# Patient Record
Sex: Female | Born: 1990 | Race: Black or African American | Hispanic: No | Marital: Single | Smoking: Never smoker
Health system: Southern US, Community
[De-identification: ages and names within clinical notes are randomized; demographics above are authoritative.]

---

## 2016-05-01 ENCOUNTER — Institutional Professional Consult (permissible substitution): Payer: Self-pay | Admitting: Pulmonary Disease

## 2016-06-01 ENCOUNTER — Ambulatory Visit (INDEPENDENT_AMBULATORY_CARE_PROVIDER_SITE_OTHER): Payer: Self-pay | Admitting: Pulmonary Disease

## 2016-06-01 ENCOUNTER — Encounter: Payer: Self-pay | Admitting: Pulmonary Disease

## 2016-06-01 VITALS — BP 120/78 | HR 95 | Ht 62.0 in | Wt 151.0 lb

## 2016-06-01 DIAGNOSIS — R0602 Shortness of breath: Secondary | ICD-10-CM

## 2016-06-01 LAB — NITRIC OXIDE: NITRIC OXIDE: 14

## 2016-06-01 NOTE — Patient Instructions (Signed)
Based on the tests we have done today and the  lack of symptoms I do not believe you have asthma. Please call us back if any additional testing is required.  Return to clinic as needed.

## 2016-06-01 NOTE — Progress Notes (Signed)
Brooke KluverBrittany Grigorian    [id number]    [birth year]  Primary Care Physician:No PCP Per Patient  Referring Physician: No referring provider defined for this encounter.  Chief complaint:  Consult for evaluation of asthma  HPI:  26 year old with no significant past medical history. She had enlisted in [employer] in 2012. She had an episode of dyspnea and was evaluated for asthma then. She apparently had pulmonary function test in 2013 with no abnormality. She is here for reevaluation as she is planning on reenlisting.  She denies any dyspnea, wheezing. She does not have any allergies. She denies any cough, sputum production, chest pain, palpitations. She is not on any medications and has no recorded allergies.  No outpatient encounter prescriptions on file as of 06/01/2016.   No facility-administered encounter medications on file as of 06/01/2016.     Allergies as of 06/01/2016  . (Not on File)    No past medical history on file.  No past surgical history on file.  No family history on file.  Social History   Social History  . Marital status: Single    Spouse name: N/A  . Number of children: N/A  . Years of education: N/A   Occupational History  . Not on file.   Social History Main Topics  . Smoking status: Never Smoker  . Smokeless tobacco: Never Used  . Alcohol use Not on file  . Drug use: Unknown  . Sexual activity: Not on file   Other Topics Concern  . Not on file   Social History Narrative  . No narrative on file    Review of systems: Review of Systems  Constitutional: Negative for fever and chills.  HENT: Negative.   Eyes: Negative for blurred vision.  Respiratory: as per HPI  Cardiovascular: Negative for chest pain and palpitations.  Gastrointestinal: Negative for vomiting, diarrhea, blood per rectum. Genitourinary: Negative for dysuria, urgency, frequency and hematuria.  Musculoskeletal: Negative for myalgias, back pain and joint pain.  Skin:  Negative for itching and rash.  Neurological: Negative for dizziness, tremors, focal weakness, seizures and loss of consciousness.  Endo/Heme/Allergies: Negative for environmental allergies.  Psychiatric/Behavioral: Negative for depression, suicidal ideas and hallucinations.  All other systems reviewed and are negative.  Physical Exam: Blood pressure 120/78, pulse 95, height 5\' 2"  (1.575 m), weight 151 lb (68.5 kg), SpO2 98 %. Gen:      No acute distress HEENT:  EOMI, sclera anicteric Neck:     No masses; no thyromegaly Lungs:    Clear to auscultation bilaterally; normal respiratory effort CV:         Regular rate and rhythm; no murmurs Abd:      + bowel sounds; soft, non-tender; no palpable masses, no distension Ext:    No edema; adequate peripheral perfusion Skin:      Warm and dry; no rash Neuro: alert and oriented x 3 Psych: normal mood and affect  Data Reviewed: FENO 06/01/16-14  PFTs 06/01/16 FVC 3.26 [107%) FEV1 2.83 [105%] F/F 87 Normal spirometry. No obstructive lung disease.  Assessment:  Assessment for asthma  Although she has a diagnosis of asthma in the past she had an isolated episode of dyspnea while exercising. There is no recurrence of symptoms since then. She does not have any signs and symptoms suggestive of asthma  I have reviewed her spirometry which is normal with no evidence of obstruction. Based on her normal PFTs, lack of symptoms and low fractional exhaled  nitric oxide (FENO)  I do not believe she has asthma.   Chilton Greathouse MD [facility] Pager [phone] 06/01/2016, 10:23 AM  CC: No ref. provider found             56

## 2018-02-05 ENCOUNTER — Emergency Department
Admission: EM | Admit: 2018-02-05 | Discharge: 2018-02-05 | Disposition: A | Discharge status: Home / Self Care | Payer: Self-pay | Attending: Emergency Medicine | Admitting: Emergency Medicine

## 2018-02-05 ENCOUNTER — Other Ambulatory Visit: Payer: Self-pay

## 2018-02-05 DIAGNOSIS — R51 Headache: Secondary | ICD-10-CM | POA: Insufficient documentation

## 2018-02-05 DIAGNOSIS — R519 Headache, unspecified: Secondary | ICD-10-CM

## 2018-02-05 MED ORDER — PROCHLORPERAZINE MALEATE 10 MG PO TABS: 10.0000 mg | ORAL_TABLET | Freq: Four times a day (QID) | ORAL | 0 refills | Status: AC | PRN

## 2018-02-05 MED ORDER — PROCHLORPERAZINE MALEATE 10 MG PO TABS
10.0000 mg | ORAL_TABLET | Freq: Once | ORAL | Status: AC
  Administered 2018-02-05: 10 mg via ORAL
  Filled 2018-02-05: qty 1

## 2018-02-05 NOTE — ED Provider Notes (Signed)
[facility] Emergency Department Provider Note   ____________________________________________   First MD Initiated Contact with Patient 02/05/18 0117     (approximate)  I have reviewed the triage vital signs and the nursing notes.   HISTORY  Chief Complaint Headache    HPI Brooke Reilly is a 27 y.o. female who presents to the ED from home with a chief complaint of headache.  Patient identifies with female pronoun.  Reports gradual onset frontal headache x2 days, constant, associated with photophobia.  Took Tylenol without relief of symptoms.  Denies history of migraines.  Denies associated fever, chills, vision changes, neck pain, chest pain, shortness of breath, abdominal pain, nausea, vomiting or dizziness.  Denies recent travel or trauma.  Denies anticoagulant use.  Does take testosterone.  Admits to increased stress.   Past medical history None  There are no active problems to display for this patient.   History reviewed. No pertinent surgical history.  Prior to Admission medications   Medication Sig Start Date End Date Taking? Authorizing Provider  prochlorperazine (COMPAZINE) 10 MG tablet Take 1 tablet (10 mg total) by mouth every 6 (six) hours as needed for nausea (headache). 02/05/18   Irean Hong, MD    Allergies Patient has no known allergies.  No family history on file.  Social History Social History   Tobacco Use  . Smoking status: Never Smoker  . Smokeless tobacco: Never Used  Substance Use Topics  . Alcohol use: Not on file  . Drug use: Not on file    Review of Systems  Constitutional: No fever/chills Eyes: No visual changes. ENT: No sore throat. Cardiovascular: Denies chest pain. Respiratory: Denies shortness of breath. Gastrointestinal: No abdominal pain.  No nausea, no vomiting.  No diarrhea.  No constipation. Genitourinary: Negative for dysuria. Musculoskeletal: Negative for back pain. Skin: Negative for  rash. Neurological: Positive for headache.  Negative for focal weakness or numbness.   ____________________________________________   PHYSICAL EXAM:  VITAL SIGNS: ED Triage Vitals  Enc Vitals Group     BP 02/05/18 0055 122/77     Pulse Rate 02/05/18 0055 82     Resp 02/05/18 0055 17     Temp 02/05/18 0055 98.1 F (36.7 C)     Temp Source 02/05/18 0055 Oral     SpO2 02/05/18 0055 97 %     Weight 02/05/18 0054 143 lb (64.9 kg)     Height 02/05/18 0054 5\' 2"  (1.575 m)     Head Circumference --      Peak Flow --      Pain Score 02/05/18 0054 10     Pain Loc --      Pain Edu? --      Excl. in GC? --     Constitutional: Alert and oriented. Well appearing and in no acute distress. Eyes: Conjunctivae are normal. PERRL. EOMI. Fundoscopy within normal limits. Head: Atraumatic. Nose: No congestion/rhinnorhea. Mouth/Throat: Mucous membranes are moist.  Oropharynx non-erythematous. Neck: No stridor.  No carotid bruits.  Supple neck without meningismus. Cardiovascular: Normal rate, regular rhythm. Grossly normal heart sounds.  Good peripheral circulation. Respiratory: Normal respiratory effort.  No retractions. Lungs CTAB. Gastrointestinal: Soft and nontender. No distention. No abdominal bruits. No CVA tenderness. Musculoskeletal: No lower extremity tenderness nor edema.  No joint effusions. Neurologic:  Normal speech and language. No gross focal neurologic deficits are appreciated. No gait instability. Skin:  Skin is warm, dry and intact. No rash noted.  No petechiae. Psychiatric: Mood  and affect are normal. Speech and behavior are normal.  ____________________________________________   LABS (all labs ordered are listed, but only abnormal results are displayed)  Labs Reviewed - No data to display ____________________________________________  EKG  None ____________________________________________  RADIOLOGY  ED MD interpretation: None  Official radiology report(s): No  results found.  ____________________________________________   PROCEDURES  Procedure(s) performed: None  Procedures  Critical Care performed: No  ____________________________________________   INITIAL IMPRESSION / ASSESSMENT AND PLAN / ED COURSE  As part of my medical decision making, I reviewed the following data within the electronic MEDICAL RECORD NUMBER Nursing notes reviewed and incorporated, Old chart reviewed and Notes from prior ED visits   27 year old female to female who presents with gradual onset headache. Differential diagnosis includes, but is not limited to, intracranial hemorrhage, meningitis/encephalitis, previous head trauma, cavernous venous thrombosis, tension headache, temporal arteritis, migraine or migraine equivalent, idiopathic intracranial hypertension, and non-specific headache.  Neck is supple and patient does not exhibit focal neurological deficits.  I did offer a CT head for reassurance but patient declines.  Patient prefers PO occasions.  Will administer Compazine now and prescription on discharge.  Strict return precautions given.  Patient verbalizes understanding agrees with plan of care.      ____________________________________________   FINAL CLINICAL IMPRESSION(S) / ED DIAGNOSES  Final diagnoses:  Acute nonintractable headache, unspecified headache type     ED Discharge Orders         Ordered    prochlorperazine (COMPAZINE) 10 MG tablet  Every 6 hours PRN     02/05/18 0129           Note:  This document was prepared using Dragon voice recognition software and may include unintentional dictation errors.    Irean HongSung,  J, MD 02/05/18 [phone]

## 2018-02-05 NOTE — ED Triage Notes (Signed)
Pt arrives to ED via POV from home with c/o "off and on" headache x2 days. Pt denies N/V/D or fever. Pt denies taking any OTC medications PTA; denies h/x of migraines or previous issues with headaches.

## 2018-02-05 NOTE — ED Notes (Signed)
Reviewed discharge instructions, follow-up care, and prescriptions with patient. Patient verbalized understanding of all information reviewed. Patient stable, with no distress noted at this time.    

## 2018-02-05 NOTE — Discharge Instructions (Addendum)
1.  You may take Compazine as needed for headache and associated nausea. 2.  Return to the ER for worsening symptoms, persistent vomiting, lethargy or other concerns.

## 2018-02-05 NOTE — ED Notes (Signed)
ED Provider at bedside. 

## 2018-02-22 ENCOUNTER — Emergency Department
Admission: EM | Admit: 2018-02-22 | Discharge: 2018-02-22 | Disposition: A | Discharge status: Home / Self Care | Payer: Self-pay | Attending: Emergency Medicine | Admitting: Emergency Medicine

## 2018-02-22 ENCOUNTER — Encounter: Payer: Self-pay | Admitting: Emergency Medicine

## 2018-02-22 ENCOUNTER — Emergency Department: Payer: Self-pay

## 2018-02-22 ENCOUNTER — Other Ambulatory Visit: Payer: Self-pay

## 2018-02-22 DIAGNOSIS — Y929 Unspecified place or not applicable: Secondary | ICD-10-CM | POA: Insufficient documentation

## 2018-02-22 DIAGNOSIS — Z79899 Other long term (current) drug therapy: Secondary | ICD-10-CM | POA: Insufficient documentation

## 2018-02-22 DIAGNOSIS — R0789 Other chest pain: Secondary | ICD-10-CM | POA: Insufficient documentation

## 2018-02-22 DIAGNOSIS — T148XXA Other injury of unspecified body region, initial encounter: Secondary | ICD-10-CM

## 2018-02-22 DIAGNOSIS — Z5321 Procedure and treatment not carried out due to patient leaving prior to being seen by health care provider: Secondary | ICD-10-CM | POA: Insufficient documentation

## 2018-02-22 DIAGNOSIS — X58XXXA Exposure to other specified factors, initial encounter: Secondary | ICD-10-CM | POA: Insufficient documentation

## 2018-02-22 DIAGNOSIS — Y939 Activity, unspecified: Secondary | ICD-10-CM | POA: Insufficient documentation

## 2018-02-22 DIAGNOSIS — R079 Chest pain, unspecified: Secondary | ICD-10-CM | POA: Insufficient documentation

## 2018-02-22 DIAGNOSIS — S29012A Strain of muscle and tendon of back wall of thorax, initial encounter: Secondary | ICD-10-CM | POA: Insufficient documentation

## 2018-02-22 DIAGNOSIS — Y999 Unspecified external cause status: Secondary | ICD-10-CM | POA: Insufficient documentation

## 2018-02-22 LAB — CBC WITH DIFFERENTIAL/PLATELET
Abs Immature Granulocytes: 0.01 10*3/uL (ref 0.00–0.07)
Basophils Absolute: 0.1 10*3/uL (ref 0.0–0.1)
Basophils Relative: 1 %
Eosinophils Absolute: 0 10*3/uL (ref 0.0–0.5)
Eosinophils Relative: 1 %
HCT: 43.7 % (ref 36.0–46.0)
Hemoglobin: 15.1 g/dL — ABNORMAL HIGH (ref 12.0–15.0)
Immature Granulocytes: 0 %
Lymphocytes Relative: 44 %
Lymphs Abs: 1.8 10*3/uL (ref 0.7–4.0)
MCH: 30.6 pg (ref 26.0–34.0)
MCHC: 34.6 g/dL (ref 30.0–36.0)
MCV: 88.5 fL (ref 80.0–100.0)
Monocytes Absolute: 0.7 10*3/uL (ref 0.1–1.0)
Monocytes Relative: 18 %
NEUTROS ABS: 1.4 10*3/uL — AB (ref 1.7–7.7)
Neutrophils Relative %: 36 %
Platelets: 169 10*3/uL (ref 150–400)
RBC: 4.94 MIL/uL (ref 3.87–5.11)
RDW: 11.9 % (ref 11.5–15.5)
SMEAR REVIEW: ADEQUATE
WBC: 4.1 10*3/uL (ref 4.0–10.5)
nRBC: 0 % (ref 0.0–0.2)

## 2018-02-22 LAB — COMPREHENSIVE METABOLIC PANEL
ALT: 19 U/L (ref 0–44)
AST: 32 U/L (ref 15–41)
Albumin: 4.6 g/dL (ref 3.5–5.0)
Alkaline Phosphatase: 61 U/L (ref 38–126)
Anion gap: 8 (ref 5–15)
BUN: 12 mg/dL (ref 6–20)
CO2: 29 mmol/L (ref 22–32)
CREATININE: 0.87 mg/dL (ref 0.44–1.00)
Calcium: 9 mg/dL (ref 8.9–10.3)
Chloride: 100 mmol/L (ref 98–111)
GFR calc Af Amer: >60 mL/min (ref >60)
GFR calc non Af Amer: >60 mL/min (ref >60)
Glucose, Bld: 105 mg/dL — ABNORMAL HIGH (ref 70–99)
Potassium: 3.2 mmol/L — ABNORMAL LOW (ref 3.5–5.1)
SODIUM: 137 mmol/L (ref 135–145)
Total Bilirubin: 0.8 mg/dL (ref 0.3–1.2)
Total Protein: 7.7 g/dL (ref 6.5–8.1)

## 2018-02-22 LAB — TROPONIN I: Troponin I: <0.03 ng/mL (ref <0.03)

## 2018-02-22 LAB — PATHOLOGIST SMEAR REVIEW

## 2018-02-22 NOTE — ED Triage Notes (Signed)
Here earlier for back/chest pain. Left immediately after chest xray because didn't want to wait. Came back requesting results of tests done and explained cannot give results without seeing a provider. Patient checked back in so can get results. No new complaints.

## 2018-02-22 NOTE — ED Notes (Signed)
No answer when called several times from lobby 

## 2018-02-22 NOTE — ED Notes (Signed)
POC URINE PREG NEGATIVE.  
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
## 2018-02-22 NOTE — ED Provider Notes (Signed)
[facility] Emergency Department Provider Note   ____________________________________________    I have reviewed the triage vital signs and the nursing notes.   HISTORY  Chief Complaint Back Pain     HPI Brooke Reilly is a 27 y.o. female who presents with complaints of left upper chest and back discomfort which started 24 hours ago.  She reports the pain is mild and worse with movement of her left arm.  Came to the ED last night had labs x-ray and then decided that it was an emergency and decided to leave.  She returns today to obtain results.  She reports she feels somewhat better.  No shortness of breath.  No pleurisy.  No calf pain or swelling.  No recent travel.  No nausea vomiting or diaphoresis.  On testerone   History reviewed. No pertinent past medical history.  There are no active problems to display for this patient.   History reviewed. No pertinent surgical history.  Prior to Admission medications   Medication Sig Start Date End Date Taking? Authorizing Provider  prochlorperazine (COMPAZINE) 10 MG tablet Take 1 tablet (10 mg total) by mouth every 6 (six) hours as needed for nausea (headache). 02/05/18   Irean HongSung, Jade J, MD     Allergies Patient has no known allergies.  History reviewed. No pertinent family history.  Social History Social History   Tobacco Use  . Smoking status: Never Smoker  . Smokeless tobacco: Never Used  Substance Use Topics  . Alcohol use: Never    Frequency: Never  . Drug use: Never    Review of Systems  Constitutional: No fever/chills Eyes: No visual changes.  ENT: No neck pain Cardiovascular: As above Respiratory: Denies shortness of breath.  No pleurisy Gastrointestinal:   No nausea, no vomiting.   Genitourinary: Negative for dysuria. Musculoskeletal: No low back pain Skin: Negative for rash. Neurological: Negative for headaches    ____________________________________________   PHYSICAL  EXAM:  VITAL SIGNS: ED Triage Vitals  Enc Vitals Group     BP 02/22/18 0652 128/88     Pulse Rate 02/22/18 0652 94     Resp 02/22/18 0652 16     Temp 02/22/18 0652 99.5 F (37.5 C)     Temp Source 02/22/18 0652 Oral     SpO2 02/22/18 0652 98 %     Weight 02/22/18 0653 65.8 kg (144 lb 15.9 oz)     Height 02/22/18 0653 1.575 m (5\' 2" )     Head Circumference --      Peak Flow --      Pain Score 02/22/18 0652 8     Pain Loc --      Pain Edu? --      Excl. in GC? --     Constitutional: Alert and oriented. No acute distress.  Eyes: Conjunctivae are normal.   Nose: No congestion/rhinnorhea. Mouth/Throat: Mucous membranes are moist.    Cardiovascular: Normal rate, regular rhythm. Grossly normal heart sounds.  Good peripheral circulation.  No chest wall tenderness palpation, pain in the left anterior superior chest wall with flexion of the left arm Respiratory: Normal respiratory effort.  No retractions. Lungs CTAB. Gastrointestinal: Soft and nontender. No distention.    Musculoskeletal: No lower extremity tenderness nor edema.  Warm and well perfused Neurologic:  Normal speech and language. No gross focal neurologic deficits are appreciated.  Skin:  Skin is warm, dry and intact. No rash noted. Psychiatric: Mood and affect are normal. Speech and behavior  are normal.  ____________________________________________   LABS (all labs ordered are listed, but only abnormal results are displayed)  Labs Reviewed - No data to display ____________________________________________  EKG  ED ECG REPORT I, Jene Every, the attending physician, personally viewed and interpreted this ECG.,  Performed at previous visit where the patient left  Date: 02/22/2018  Rhythm: normal sinus rhythm QRS Axis: normal Intervals: normal ST/T Wave abnormalities: Nonspecific T wave changes   ____________________________________________  RADIOLOGY  Chest x-ray  normal ____________________________________________   PROCEDURES  Procedure(s) performed: No  Procedures   Critical Care performed: No ____________________________________________   INITIAL IMPRESSION / ASSESSMENT AND PLAN / ED COURSE  Pertinent labs & imaging results that were available during my care of the patient were reviewed by me and considered in my medical decision making (see chart for details).  Patient overall well-appearing in no acute distress.  Very low risk for ACS.  EKG with nonspecific changes.  Troponin is normal.  Given exam with reproducible pain with left arm movement strongly suspect muscle strain.  Chest x-ray labs unremarkable.  Patient well-appearing, recommend supportive care outpatient follow-up, return precautions discussed    ____________________________________________   FINAL CLINICAL IMPRESSION(S) / ED DIAGNOSES  Final diagnoses:  Muscle strain  Chest wall pain        Note:  This document was prepared using Dragon voice recognition software and may include unintentional dictation errors.    Jene Every, MD 02/22/18 [phone]

## 2020-10-17 IMAGING — CR DG CHEST 2V
2 series · 2 of 2 positions shown · non-contrast
Comparison: None.

CLINICAL DATA: 27-year-old female with chest pain.

EXAM:
CHEST - 2 VIEW

[chest pa]
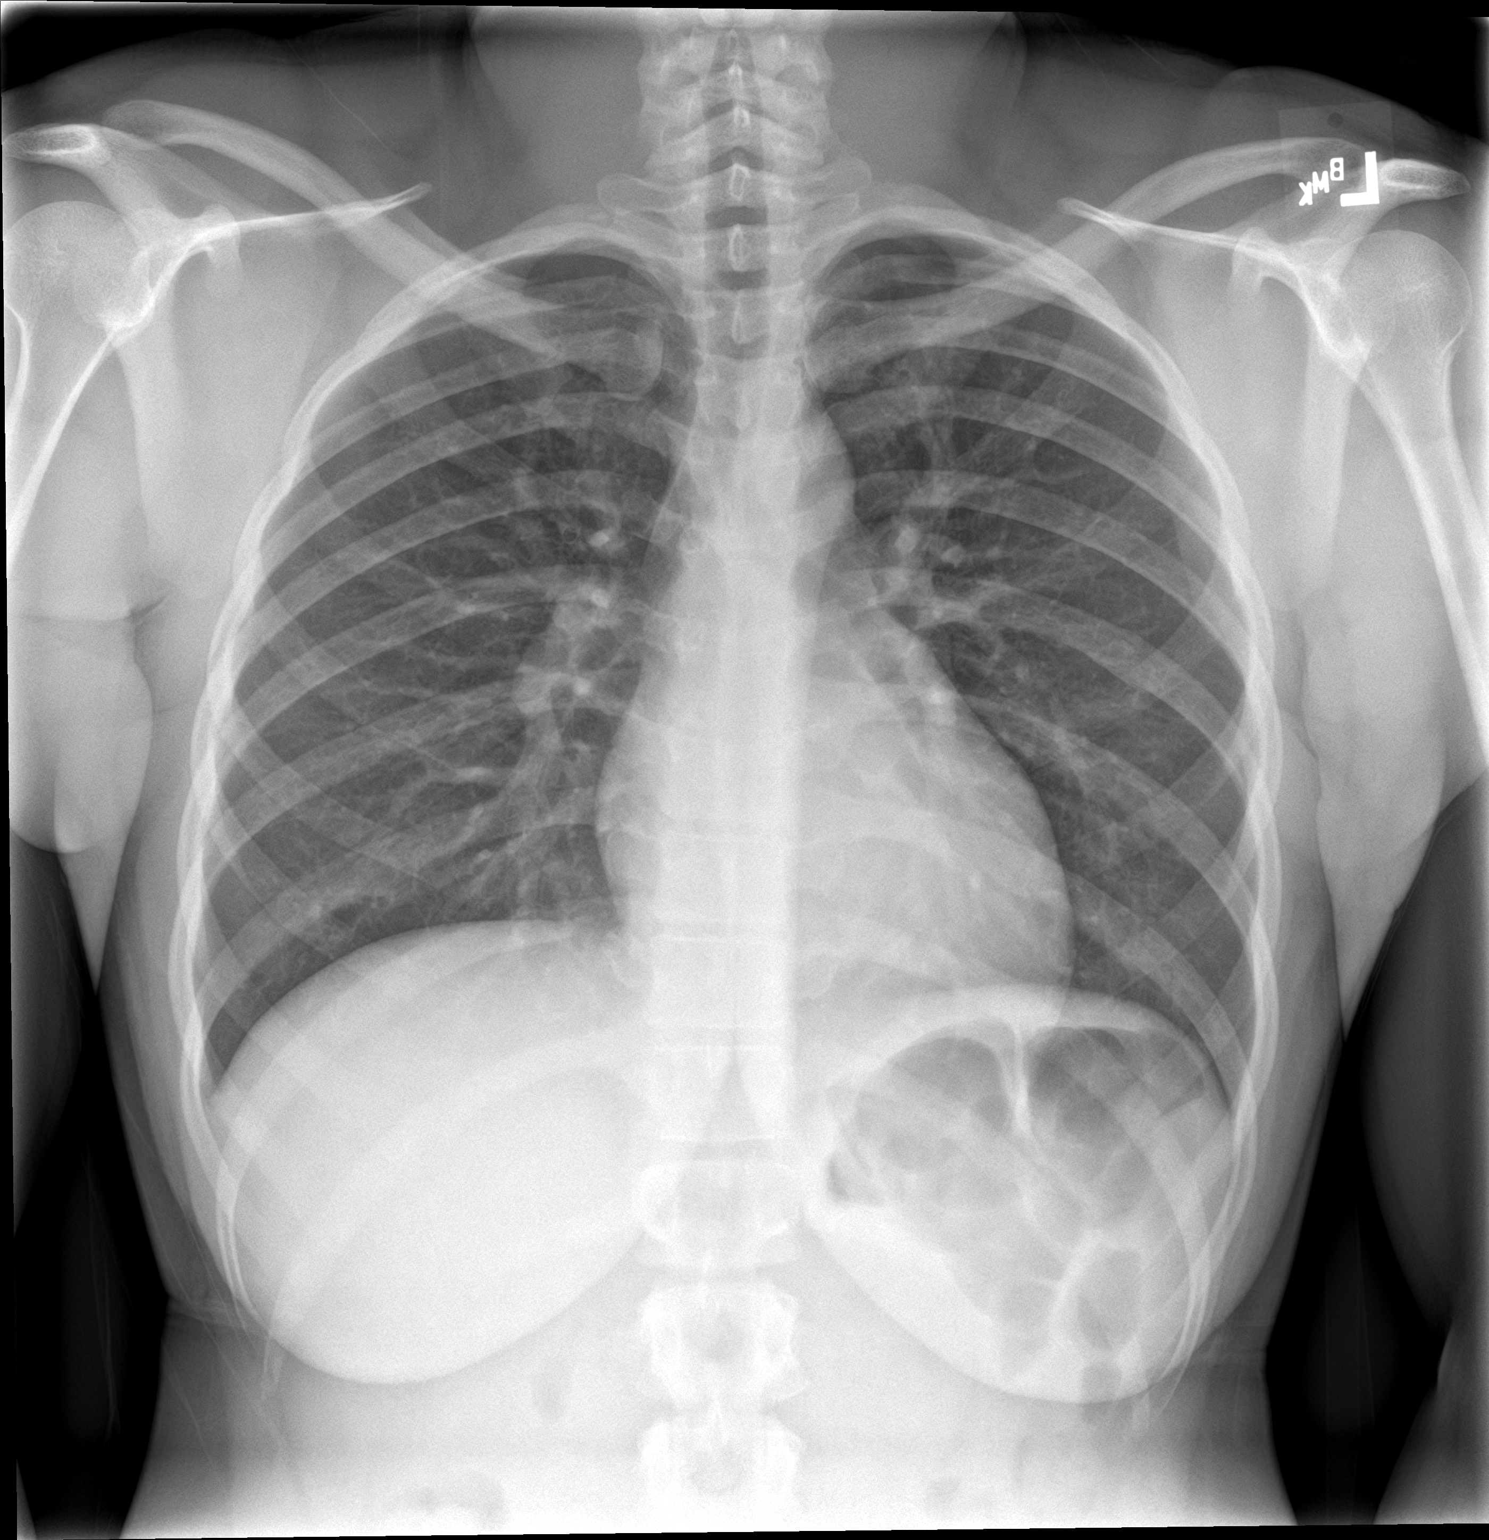

[chest lat]
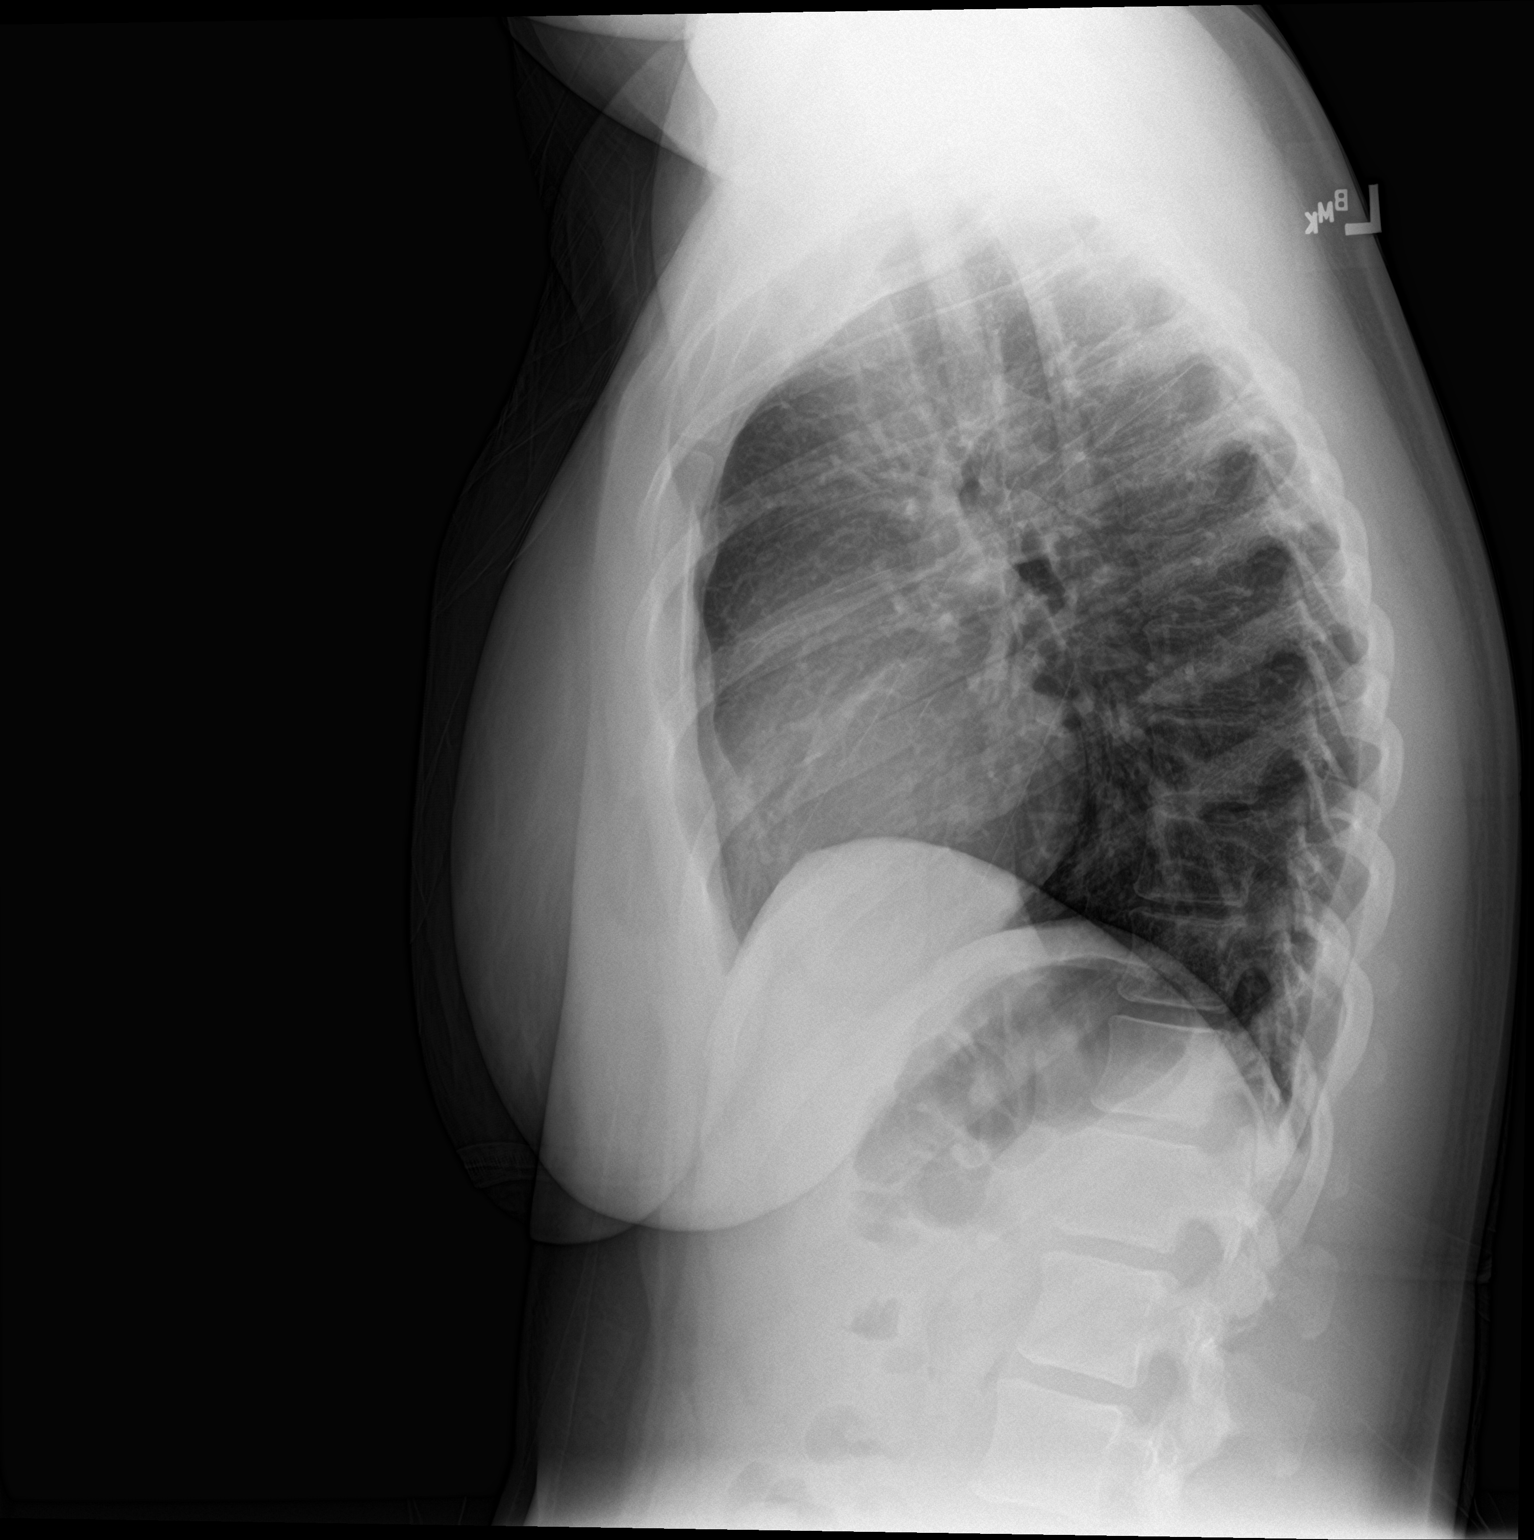

[2 of 2 positions shown; findings below may reference images not displayed]

FINDINGS: The heart size and mediastinal contours are within normal limits.
Both lungs are clear. The visualized skeletal structures are
unremarkable.
IMPRESSION: No active cardiopulmonary disease.

## 2022-01-24 ENCOUNTER — Other Ambulatory Visit: Payer: Self-pay

## 2022-01-24 ENCOUNTER — Emergency Department
Admission: EM | Admit: 2022-01-24 | Discharge: 2022-01-24 | Disposition: A | Discharge status: Home / Self Care | Payer: Worker's Compensation | Attending: Emergency Medicine | Admitting: Emergency Medicine

## 2022-01-24 DIAGNOSIS — Y990 Civilian activity done for income or pay: Secondary | ICD-10-CM | POA: Diagnosis not present

## 2022-01-24 DIAGNOSIS — T25021A Burn of unspecified degree of right foot, initial encounter: Secondary | ICD-10-CM | POA: Diagnosis present

## 2022-01-24 DIAGNOSIS — X110XXA Contact with hot water in bath or tub, initial encounter: Secondary | ICD-10-CM | POA: Insufficient documentation

## 2022-01-24 DIAGNOSIS — T25221A Burn of second degree of right foot, initial encounter: Secondary | ICD-10-CM | POA: Diagnosis not present

## 2022-01-24 DIAGNOSIS — T310 Burns involving less than 10% of body surface: Secondary | ICD-10-CM | POA: Insufficient documentation

## 2022-01-24 MED ORDER — NAPROXEN 500 MG PO TABS: 500.0000 mg | ORAL_TABLET | Freq: Two times a day (BID) | ORAL | 0 refills | Status: AC

## 2022-01-24 MED ORDER — SILVER SULFADIAZINE 1 % EX CREA
TOPICAL_CREAM | Freq: Once | CUTANEOUS | Status: AC
  Filled 2022-01-24: qty 20

## 2022-01-24 MED ORDER — SILVER SULFADIAZINE 1 % EX CREA: TOPICAL_CREAM | CUTANEOUS | 1 refills | Status: AC

## 2022-01-24 NOTE — ED Notes (Signed)
Patient declined discharge vital signs. 

## 2022-01-24 NOTE — ED Notes (Signed)
[facility], who stated he would need to follow-up with the temp agency.
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
## 2022-01-24 NOTE — ED Notes (Signed)
Silvadene applied to wound on dorsum of right foot after cleaning. Non-adherent placed over Silvadene and area was wrapped with kling. Patient tolerated procedure well.

## 2022-01-24 NOTE — Discharge Instructions (Addendum)
Please monitor closely for any sign or concern for infection.  Return to the ER if not improving over the next few days.
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
